# Patient Record
Sex: Female | Born: 1935 | Race: White | Hispanic: No | Marital: Married | Smoking: Never smoker
Health system: Southern US, Community
[De-identification: ages and names within clinical notes are randomized; demographics above are authoritative.]

## PROBLEM LIST (undated history)

## (undated) DIAGNOSIS — I10 Essential (primary) hypertension: Secondary | ICD-10-CM

## (undated) DIAGNOSIS — I639 Cerebral infarction, unspecified: Secondary | ICD-10-CM

## (undated) DIAGNOSIS — E039 Hypothyroidism, unspecified: Secondary | ICD-10-CM

---

## 1969-10-15 HISTORY — PX: BACK SURGERY: SHX140

## 2016-01-30 ENCOUNTER — Ambulatory Visit: Admit: 2016-01-30 | Payer: Self-pay | Admitting: Orthopaedic Surgery

## 2016-01-30 SURGERY — ARTHROSCOPY, KNEE
Anesthesia: Monitor Anesthesia Care | Laterality: Right

## 2016-03-02 NOTE — Progress Notes (Signed)
Please complete pt assessment on DOS; pre-op instructions given only.  

## 2016-03-02 NOTE — Progress Notes (Signed)
Pt denies SOB, chest pain, and being under the care of a cardiologist. Pt denies having a stress test , echo and cardiac cath. Pt stated that an EKG was done within the last year by PCP, Dr. Michel SanteeFavero; records requested. Pt not sure if she had a chest x ray with in the last year. Pt made aware to stop  taking Aspirin, vitamins, fish oil and herbal medications. Do not take any NSAIDs ie: Ibuprofen, Advil, Naproxen, BC and Goody Powder or any medication containing Aspirin. Pt verbalized understanding of all pre-op instructions.

## 2016-03-04 NOTE — Anesthesia Preprocedure Evaluation (Signed)
Anesthesia Evaluation  Patient identified by MRN, date of birth, ID band Patient awake    Reviewed: Allergy & Precautions, H&P , NPO status , Patient's Chart, lab work & pertinent test results  History of Anesthesia Complications Negative for: history of anesthetic complications  Airway Mallampati: II  TM Distance: >3 FB Neck ROM: full    Dental no notable dental hx.    Pulmonary neg pulmonary ROS,    Pulmonary exam normal breath sounds clear to auscultation       Cardiovascular negative cardio ROS Normal cardiovascular exam Rhythm:regular Rate:Normal     Neuro/Psych negative neurological ROS     GI/Hepatic negative GI ROS, Neg liver ROS,   Endo/Other  negative endocrine ROS  Renal/GU negative Renal ROS     Musculoskeletal   Abdominal   Peds  Hematology negative hematology ROS (+)   Anesthesia Other Findings   Reproductive/Obstetrics negative OB ROS                             Anesthesia Physical Anesthesia Plan  ASA: II  Anesthesia Plan: General   Post-op Pain Management:    Induction: Intravenous  Airway Management Planned: LMA  Additional Equipment:   Intra-op Plan:   Post-operative Plan:   Informed Consent: I have reviewed the patients History and Physical, chart, labs and discussed the procedure including the risks, benefits and alternatives for the proposed anesthesia with the patient or authorized representative who has indicated his/her understanding and acceptance.   Dental Advisory Given  Plan Discussed with: Anesthesiologist, CRNA and Surgeon  Anesthesia Plan Comments:         Anesthesia Quick Evaluation  

## 2016-03-04 NOTE — H&P (Signed)
Ann Holland is an 82 y.o. female.    Chief Complaint:  Right knee pain and mechanical symptoms  Patient returns having persistent problems with her knee.  She has had 2 injections by me.  She has had persistent problems with her knee with swelling, catching, she has not had any true walking.  She has known chondrocalcinosis.  X-rays demonstrate some degenerative changes.  An MRI scan was obtained which shows medial and lateral meniscal tears, as well as there is partial cartilage thickness wear and some areas of more significant cartilage wear.  Ligaments are intact.  There is some patellofemoral degenerative changes, medial tibial plateau wear.  Radial tear of the medial meniscus, horizontal tear of the lateral meniscus.  CURRENT MEDICATIONS:  She takes metoprolol, lisinopril, Synthroid, vitamin D3, astaxanthin 4 mg daily, krill oil and over-the-counter Curamin 3 tablets daily.        ALLERGIES:  She has no allergies.   PAST SURGICAL HISTORY:  Previous back surgery in 1971   SOCIAL HISTORY:  The patient is married to her husband Ann Holland.  She is here with her daughter.  She does not smoke.  She drinks a glass of wine at night.   FAMILY HISTORY:  Positive for hypertension.   REVIEW OF SYSTEMS:  Positive for knee arthritis, cataracts, hypertension, osteoporosis, mini-stroke, thyroid condition.     No results found for this or any previous visit (from the past 48 hour(s)). No results found.  Review of Systems  Constitutional: Negative.   HENT: Negative.   Eyes: Negative.   Respiratory: Negative.   Cardiovascular: Negative.   Gastrointestinal: Negative.   Genitourinary: Negative.   Musculoskeletal: Positive for joint pain.  Skin: Negative.     There were no vitals taken for this visit. Physical Exam  Constitutional: She is oriented to person, place, and time. No distress.  HENT:  Head: Atraumatic.  Eyes: EOM are normal.  Neck: Normal range of motion.  Cardiovascular: Normal  rate.   Respiratory: No respiratory distress.  GI: She exhibits no distension.  Neurological: She is alert and oriented to person, place, and time.  Skin: Skin is warm and dry.  Psychiatric: She has a normal mood and affect.   PHYSICAL EXAMINATION:  The patient is 5 feet 3 inches tall and weighs 155 pounds.  She is alert and oriented.  WD/WN.  NAD.  Extraocular movements are intact.  Good visual acuity with glasses.  There is no supraclavicular lymphadenopathy.  She has a short stride gait.  She ambulates with a cane.  There is mild bilateral knee swelling.  She has bilateral venous stasis changes in her legs with pitting edema and mild discoloration.  She is on a calcium channel blocker and also lisinopril.  At one point in the past she was on a diuretic.  BP is 131/65.  The collateral ligaments are stable.  There is patellofemoral crepitus with loading and pain with resisted extension and flexion. She has medial and lateral joint line tenderness.   RADIOGRAPHS/TESTS:  X-rays demonstrate chondrocalcinosis of the meniscus, more lateral than medial meniscus.  There is symmetric joint line narrowing and mild osteophyte spurring consistent with moderate arthritis.   Assessment Right knee DJD, medial/lateral meniscus tears, pain and mechanical symptoms.   PLAN:  We discussed options.  I do not think the scan which we reviewed, and I gave her a copy of the report, indicates she needs to consider total knee arthroplasty at this time.  We discussed options since she has  not responded to anti-inflammatories, intra-articular injections.  She understands that arthroscopy  might give her some improvement.  She understands the results of that are variable.  Treatment for the torn meniscus does well, however debridement for arthritis results are variable.  She will consider this and call if she would like to proceed.  She would need to have a preoperative clearance from [place].  Naida SleightWENS,Jaena Brocato M, PA-C 03/04/2016, 3:54 PM

## 2016-03-05 ENCOUNTER — Ambulatory Visit (HOSPITAL_COMMUNITY)
Admission: RE | Admit: 2016-03-05 | Discharge: 2016-03-05 | Disposition: A | Discharge status: Home / Self Care | Payer: Medicare Other | Source: Ambulatory Visit | Attending: Orthopaedic Surgery | Admitting: Orthopaedic Surgery

## 2016-03-05 ENCOUNTER — Ambulatory Visit (HOSPITAL_COMMUNITY): Payer: Medicare Other | Admitting: Anesthesiology

## 2016-03-05 ENCOUNTER — Ambulatory Visit (HOSPITAL_COMMUNITY): Payer: Medicare Other

## 2016-03-05 ENCOUNTER — Encounter (HOSPITAL_COMMUNITY)
Admission: RE | Disposition: A | Discharge status: Home / Self Care | Payer: Self-pay | Source: Ambulatory Visit | Attending: Orthopaedic Surgery

## 2016-03-05 ENCOUNTER — Encounter (HOSPITAL_COMMUNITY): Payer: Self-pay | Admitting: *Deleted

## 2016-03-05 DIAGNOSIS — Z8673 Personal history of transient ischemic attack (TIA), and cerebral infarction without residual deficits: Secondary | ICD-10-CM | POA: Insufficient documentation

## 2016-03-05 DIAGNOSIS — H269 Unspecified cataract: Secondary | ICD-10-CM | POA: Insufficient documentation

## 2016-03-05 DIAGNOSIS — I4510 Unspecified right bundle-branch block: Secondary | ICD-10-CM | POA: Diagnosis not present

## 2016-03-05 DIAGNOSIS — M11261 Other chondrocalcinosis, right knee: Secondary | ICD-10-CM | POA: Insufficient documentation

## 2016-03-05 DIAGNOSIS — Z8249 Family history of ischemic heart disease and other diseases of the circulatory system: Secondary | ICD-10-CM | POA: Insufficient documentation

## 2016-03-05 DIAGNOSIS — M23211 Derangement of anterior horn of medial meniscus due to old tear or injury, right knee: Secondary | ICD-10-CM | POA: Diagnosis not present

## 2016-03-05 DIAGNOSIS — M810 Age-related osteoporosis without current pathological fracture: Secondary | ICD-10-CM | POA: Insufficient documentation

## 2016-03-05 DIAGNOSIS — M2550 Pain in unspecified joint: Secondary | ICD-10-CM | POA: Diagnosis not present

## 2016-03-05 DIAGNOSIS — M23361 Other meniscus derangements, other lateral meniscus, right knee: Secondary | ICD-10-CM | POA: Insufficient documentation

## 2016-03-05 DIAGNOSIS — M170 Bilateral primary osteoarthritis of knee: Secondary | ICD-10-CM | POA: Diagnosis not present

## 2016-03-05 DIAGNOSIS — M23221 Derangement of posterior horn of medial meniscus due to old tear or injury, right knee: Secondary | ICD-10-CM | POA: Insufficient documentation

## 2016-03-05 DIAGNOSIS — E079 Disorder of thyroid, unspecified: Secondary | ICD-10-CM | POA: Insufficient documentation

## 2016-03-05 DIAGNOSIS — Z01818 Encounter for other preprocedural examination: Secondary | ICD-10-CM

## 2016-03-05 DIAGNOSIS — Z79899 Other long term (current) drug therapy: Secondary | ICD-10-CM | POA: Diagnosis not present

## 2016-03-05 HISTORY — DX: Essential (primary) hypertension: I10

## 2016-03-05 HISTORY — DX: Cerebral infarction, unspecified: I63.9

## 2016-03-05 HISTORY — DX: Hypothyroidism, unspecified: E03.9

## 2016-03-05 HISTORY — PX: KNEE ARTHROSCOPY: SHX127

## 2016-03-05 LAB — CBC
HCT: 47 % — ABNORMAL HIGH (ref 36.0–46.0)
Hemoglobin: 15.5 g/dL — ABNORMAL HIGH (ref 12.0–15.0)
MCH: 31.1 pg (ref 26.0–34.0)
MCHC: 33 g/dL (ref 30.0–36.0)
MCV: 94.4 fL (ref 78.0–100.0)
PLATELETS: 132 10*3/uL — AB (ref 150–400)
RBC: 4.98 MIL/uL (ref 3.87–5.11)
RDW: 13.1 % (ref 11.5–15.5)
WBC: 5.3 10*3/uL (ref 4.0–10.5)

## 2016-03-05 LAB — COMPREHENSIVE METABOLIC PANEL
ALT: 15 U/L (ref 14–54)
ANION GAP: 9 (ref 5–15)
AST: 18 U/L (ref 15–41)
Albumin: 3.9 g/dL (ref 3.5–5.0)
Alkaline Phosphatase: 54 U/L (ref 38–126)
BUN: 15 mg/dL (ref 6–20)
CHLORIDE: 107 mmol/L (ref 101–111)
CO2: 24 mmol/L (ref 22–32)
Calcium: 9.8 mg/dL (ref 8.9–10.3)
Creatinine, Ser: 1.01 mg/dL — ABNORMAL HIGH (ref 0.44–1.00)
GFR, EST AFRICAN AMERICAN: 58 mL/min — AB (ref >60)
GFR, EST NON AFRICAN AMERICAN: 50 mL/min — AB (ref >60)
Glucose, Bld: 117 mg/dL — ABNORMAL HIGH (ref 65–99)
POTASSIUM: 4.5 mmol/L (ref 3.5–5.1)
Sodium: 140 mmol/L (ref 135–145)
TOTAL PROTEIN: 6.8 g/dL (ref 6.5–8.1)
Total Bilirubin: 0.6 mg/dL (ref 0.3–1.2)

## 2016-03-05 LAB — URINALYSIS, ROUTINE W REFLEX MICROSCOPIC
BILIRUBIN URINE: NEGATIVE
Glucose, UA: NEGATIVE mg/dL
HGB URINE DIPSTICK: NEGATIVE
KETONES UR: NEGATIVE mg/dL
Leukocytes, UA: NEGATIVE
NITRITE: NEGATIVE
Protein, ur: NEGATIVE mg/dL
Specific Gravity, Urine: 1.015 (ref 1.005–1.030)
pH: 6.5 (ref 5.0–8.0)

## 2016-03-05 LAB — PROTIME-INR
INR: 0.96 (ref 0.00–1.49)
PROTHROMBIN TIME: 13 s (ref 11.6–15.2)

## 2016-03-05 LAB — APTT: aPTT: 21 seconds — ABNORMAL LOW (ref 24–37)

## 2016-03-05 SURGERY — ARTHROSCOPY, KNEE
Anesthesia: General | Site: Knee | Laterality: Right

## 2016-03-05 MED ORDER — ONDANSETRON HCL 4 MG/2ML IJ SOLN
INTRAMUSCULAR | Status: AC
  Filled 2016-03-05: qty 2

## 2016-03-05 MED ORDER — EPHEDRINE SULFATE 50 MG/ML IJ SOLN
INTRAMUSCULAR | Status: DC | PRN
  Administered 2016-03-05 (×3): 10 mg via INTRAVENOUS

## 2016-03-05 MED ORDER — FENTANYL CITRATE (PF) 100 MCG/2ML IJ SOLN
25.0000 ug | INTRAMUSCULAR | Status: DC | PRN
  Administered 2016-03-05 (×2): 25 ug via INTRAVENOUS
  Administered 2016-03-05: 50 ug via INTRAVENOUS

## 2016-03-05 MED ORDER — HYDROCODONE-ACETAMINOPHEN 7.5-325 MG PO TABS: 1.0000 | ORAL_TABLET | Freq: Four times a day (QID) | ORAL | Status: DC | PRN

## 2016-03-05 MED ORDER — PROPOFOL 10 MG/ML IV BOLUS
INTRAVENOUS | Status: AC
  Filled 2016-03-05: qty 20

## 2016-03-05 MED ORDER — HYDROCODONE-ACETAMINOPHEN 7.5-325 MG PO TABS: 1.0000 | ORAL_TABLET | Freq: Four times a day (QID) | ORAL | Status: AC | PRN

## 2016-03-05 MED ORDER — SODIUM CHLORIDE 0.9 % IR SOLN
Status: DC | PRN
  Administered 2016-03-05: 3000 mL

## 2016-03-05 MED ORDER — EPHEDRINE 5 MG/ML INJ
INTRAVENOUS | Status: AC
  Filled 2016-03-05: qty 10

## 2016-03-05 MED ORDER — PROPOFOL 10 MG/ML IV BOLUS
INTRAVENOUS | Status: DC | PRN
  Administered 2016-03-05: 150 mg via INTRAVENOUS

## 2016-03-05 MED ORDER — CHLORHEXIDINE GLUCONATE 4 % EX LIQD: 60.0000 mL | Freq: Once | CUTANEOUS | Status: DC

## 2016-03-05 MED ORDER — ONDANSETRON HCL 4 MG/2ML IJ SOLN
INTRAMUSCULAR | Status: DC | PRN
  Administered 2016-03-05: 4 mg via INTRAVENOUS

## 2016-03-05 MED ORDER — HYDROCODONE-ACETAMINOPHEN 7.5-325 MG PO TABS
1.0000 | ORAL_TABLET | Freq: Once | ORAL | Status: AC
  Administered 2016-03-05: 1 via ORAL

## 2016-03-05 MED ORDER — ONDANSETRON HCL 4 MG/2ML IJ SOLN: 4.0000 mg | Freq: Once | INTRAMUSCULAR | Status: DC | PRN

## 2016-03-05 MED ORDER — FENTANYL CITRATE (PF) 100 MCG/2ML IJ SOLN
INTRAMUSCULAR | Status: DC | PRN
  Administered 2016-03-05 (×5): 50 ug via INTRAVENOUS

## 2016-03-05 MED ORDER — LACTATED RINGERS IV SOLN
INTRAVENOUS | Status: DC
  Administered 2016-03-05: 09:00:00 via INTRAVENOUS

## 2016-03-05 MED ORDER — HYDROCODONE-ACETAMINOPHEN 7.5-325 MG PO TABS
ORAL_TABLET | ORAL | Status: AC
  Filled 2016-03-05: qty 2

## 2016-03-05 MED ORDER — BUPIVACAINE-EPINEPHRINE 0.25% -1:200000 IJ SOLN
INTRAMUSCULAR | Status: DC | PRN
  Administered 2016-03-05: 20 mL

## 2016-03-05 MED ORDER — FENTANYL CITRATE (PF) 250 MCG/5ML IJ SOLN
INTRAMUSCULAR | Status: AC
  Filled 2016-03-05: qty 5

## 2016-03-05 MED ORDER — CEFAZOLIN SODIUM-DEXTROSE 2-4 GM/100ML-% IV SOLN
2.0000 g | INTRAVENOUS | Status: AC
  Administered 2016-03-05: 2 g via INTRAVENOUS
  Filled 2016-03-05: qty 100

## 2016-03-05 MED ORDER — PHENYLEPHRINE HCL 10 MG/ML IJ SOLN
INTRAMUSCULAR | Status: DC | PRN
  Administered 2016-03-05: 80 ug via INTRAVENOUS

## 2016-03-05 MED ORDER — FENTANYL CITRATE (PF) 100 MCG/2ML IJ SOLN
INTRAMUSCULAR | Status: AC
  Administered 2016-03-05: 25 ug via INTRAVENOUS
  Filled 2016-03-05: qty 2

## 2016-03-05 MED ORDER — HYDROCODONE-ACETAMINOPHEN 7.5-325 MG PO TABS
1.0000 | ORAL_TABLET | Freq: Once | ORAL | Status: AC | PRN
  Administered 2016-03-05: 1 via ORAL

## 2016-03-05 MED ORDER — BUPIVACAINE-EPINEPHRINE (PF) 0.25% -1:200000 IJ SOLN
INTRAMUSCULAR | Status: AC
  Filled 2016-03-05: qty 30

## 2016-03-05 MED ORDER — DEXTROSE 5 % IV SOLN
10.0000 mg | INTRAVENOUS | Status: DC | PRN
  Administered 2016-03-05: 30 ug/min via INTRAVENOUS

## 2016-03-05 SURGICAL SUPPLY — 36 items
BANDAGE ACE 6X5 VEL STRL LF (GAUZE/BANDAGES/DRESSINGS) ×6 IMPLANT
BANDAGE ELASTIC 6 VELCRO ST LF (GAUZE/BANDAGES/DRESSINGS) ×3 IMPLANT
BENZOIN TINCTURE PRP APPL 2/3 (GAUZE/BANDAGES/DRESSINGS) ×3 IMPLANT
BLADE CUDA 4.2 (BLADE) ×3 IMPLANT
BLADE CUDA 5.5 (BLADE) IMPLANT
BLADE GREAT WHITE 4.2 (BLADE) IMPLANT
BLADE GREAT WHITE 4.2MM (BLADE)
BOOTCOVER CLEANROOM LRG (PROTECTIVE WEAR) ×6 IMPLANT
BUR OVAL 6.0 (BURR) IMPLANT
CLOSURE STERI-STRIP 1/2X4 (GAUZE/BANDAGES/DRESSINGS) ×1
CLOSURE WOUND 1/2 X4 (GAUZE/BANDAGES/DRESSINGS) ×1
CLSR STERI-STRIP ANTIMIC 1/2X4 (GAUZE/BANDAGES/DRESSINGS) ×2 IMPLANT
COVER SURGICAL LIGHT HANDLE (MISCELLANEOUS) ×3 IMPLANT
CUFF TOURNIQUET SINGLE 34IN LL (TOURNIQUET CUFF) IMPLANT
CUFF TOURNIQUET SINGLE 44IN (TOURNIQUET CUFF) IMPLANT
DRAPE ARTHROSCOPY W/POUCH 114 (DRAPES) ×3 IMPLANT
DRSG PAD ABDOMINAL 8X10 ST (GAUZE/BANDAGES/DRESSINGS) ×3 IMPLANT
DURAPREP 26ML APPLICATOR (WOUND CARE) ×3 IMPLANT
GAUZE SPONGE 4X4 12PLY STRL (GAUZE/BANDAGES/DRESSINGS) ×3 IMPLANT
GAUZE XEROFORM 1X8 LF (GAUZE/BANDAGES/DRESSINGS) IMPLANT
GLOVE BIOGEL PI IND STRL 8 (GLOVE) ×1 IMPLANT
GLOVE BIOGEL PI INDICATOR 8 (GLOVE) ×2
GLOVE ORTHO TXT STRL SZ7.5 (GLOVE) ×3 IMPLANT
GOWN STRL REUS W/ TWL LRG LVL3 (GOWN DISPOSABLE) ×2 IMPLANT
GOWN STRL REUS W/TWL 2XL LVL3 (GOWN DISPOSABLE) IMPLANT
GOWN STRL REUS W/TWL LRG LVL3 (GOWN DISPOSABLE) ×4
KIT ROOM TURNOVER OR (KITS) ×3 IMPLANT
NEEDLE HYPO 25GX1X1/2 BEV (NEEDLE) IMPLANT
PACK ARTHROSCOPY DSU (CUSTOM PROCEDURE TRAY) ×3 IMPLANT
PAD ARMBOARD 7.5X6 YLW CONV (MISCELLANEOUS) ×6 IMPLANT
PADDING CAST COTTON 6X4 STRL (CAST SUPPLIES) ×6 IMPLANT
SET ARTHROSCOPY TUBING (MISCELLANEOUS) ×2
SET ARTHROSCOPY TUBING LN (MISCELLANEOUS) ×1 IMPLANT
SPONGE LAP 4X18 X RAY DECT (DISPOSABLE) ×3 IMPLANT
STRIP CLOSURE SKIN 1/2X4 (GAUZE/BANDAGES/DRESSINGS) ×2 IMPLANT
TOWEL OR 17X24 6PK STRL BLUE (TOWEL DISPOSABLE) ×6 IMPLANT

## 2016-03-05 NOTE — Interval H&P Note (Signed)
History and Physical Interval Note:  03/05/2016 10:30 AM  Delorise RoyalsAdelia Katrinka Holland  has presented today for surgery, with the diagnosis of Right Knee Meniscal Tears  The various methods of treatment have been discussed with the patient and family. After consideration of risks, benefits and other options for treatment, the patient has consented to  Procedure(s): Right Knee Arthroscopy, Partial Medial and Lateral Menisectomy (Right) as a surgical intervention .  The patient's history has been reviewed, patient examined, no change in status, stable for surgery.  I have reviewed the patient's chart and labs.  Questions were answered to the patient's satisfaction.     Arwin Bisceglia C

## 2016-03-05 NOTE — Brief Op Note (Signed)
03/05/2016  12:03 PM  PATIENT:  Donn PieriniAdelia Costabile  82 y.o. female  PRE-OPERATIVE DIAGNOSIS:  Right Knee Meniscal Tears  POST-OPERATIVE DIAGNOSIS:  Right Knee Meniscal Tears  PROCEDURE:  Procedure(s): Right Knee Arthroscopy, Partial Medial and Lateral Menisectomy (Right)  SURGEON:  Surgeon(s) and Role:    Eldred Manges* Mark C Yates, MD - Primary     ANESTHESIA:   general  EBL:  Total I/O In: 800 [I.V.:800] Out: 10 [Blood:10]  BLOOD ADMINISTERED:none  DRAINS: none   LOCAL MEDICATIONS USED:  NONE  SPECIMEN:  No Specimen  DISPOSITION OF SPECIMEN:  N/A  COUNTS:  YES  TOURNIQUET:    DICTATION: .Dragon Dictation  PLAN OF CARE: Discharge to home after PACU  PATIENT DISPOSITION:  PACU - hemodynamically stable.

## 2016-03-05 NOTE — Anesthesia Procedure Notes (Signed)
Procedure Name: LMA Insertion Date/Time: 03/05/2016 10:43 AM Performed by: Rosiland OzMEYERS, Jolena Kittle Pre-anesthesia Checklist: Emergency Drugs available, Suction available, Patient identified, Timeout performed and Patient being monitored Patient Re-evaluated:Patient Re-evaluated prior to inductionOxygen Delivery Method: Circle system utilized Preoxygenation: Pre-oxygenation with 100% oxygen Intubation Type: IV induction LMA: LMA inserted LMA Size: 4.0 Placement Confirmation: positive ETCO2 and breath sounds checked- equal and bilateral Tube secured with: Tape Dental Injury: Teeth and Oropharynx as per pre-operative assessment

## 2016-03-05 NOTE — Anesthesia Postprocedure Evaluation (Signed)
Anesthesia Post Note  Patient: Ann Holland  Procedure(s) Performed: Procedure(s) (LRB): Right Knee Arthroscopy, Partial Medial and Lateral Menisectomy (Right)  Patient location during evaluation: PACU Anesthesia Type: General Level of consciousness: awake and alert Pain management: pain level controlled Vital Signs Assessment: post-procedure vital signs reviewed and stable Respiratory status: spontaneous breathing, nonlabored ventilation, respiratory function stable and patient connected to nasal cannula oxygen Cardiovascular status: blood pressure returned to baseline and stable Postop Assessment: no signs of nausea or vomiting Anesthetic complications: no    Last Vitals:  Filed Vitals:   03/05/16 1145 03/05/16 1200  BP: 129/54 130/45  Pulse: 72 65  Temp:    Resp: 20 14    Last Pain:  Filed Vitals:   03/05/16 1214  PainSc: 0-No pain                 Reino KentJudd, Dayami Taitt J

## 2016-03-05 NOTE — Transfer of Care (Signed)
Immediate Anesthesia Transfer of Care Note  Patient: Ann Holland  Procedure(s) Performed: Procedure(s): Right Knee Arthroscopy, Partial Medial and Lateral Menisectomy (Right)  Patient Location: PACU  Anesthesia Type:General  Level of Consciousness: awake, alert , oriented and patient cooperative  Airway & Oxygen Therapy: Patient Spontanous Breathing  Post-op Assessment: Report given to RN, Post -op Vital signs reviewed and stable and Patient moving all extremities X 4  Post vital signs: Reviewed and stable  Last Vitals:  Filed Vitals:   03/05/16 0848  BP: 167/56  Pulse: 70  Temp: 37.2 C  Resp: 20    Last Pain:  Filed Vitals:   03/05/16 0910  PainSc: 5       Patients Stated Pain Goal: 5 (03/05/16 0908)  Complications: No apparent anesthesia complications

## 2016-03-06 ENCOUNTER — Encounter (HOSPITAL_COMMUNITY): Payer: Self-pay | Admitting: Orthopaedic Surgery

## 2016-03-06 NOTE — Op Note (Signed)
NAME:  Ann Holland, Ann Holland                ACCOUNT NO.:  [id number]  MEDICAL RECORD NO.:  [id number]  LOCATION:  [facility]                         FACILITY:  [facility]  PHYSICIAN:  Naiah Donahoe C. Ophelia CharterYates, M.D.    DATE OF BIRTH:  November 25, 1929  DATE OF PROCEDURE:  03/05/2016 DATE OF DISCHARGE:  03/05/2016                              OPERATIVE REPORT   PREOPERATIVE DIAGNOSIS:  Right knee medial lateral meniscal tears.  POSTOPERATIVE DIAGNOSIS:  Right knee medial lateral meniscal tears.  PROCEDURE:  Diagnostic operative arthroscopy, right knee, partial medial meniscectomy and chondral debridement (no partial lateral meniscectomy was performed).  SURGEON:  Japneet Staggs C. Ophelia CharterYates, M.D.  ANESTHESIA:  MAC.  INDICATIONS:  This 82 year old female, who has had persistent pain and catching of her knee with MRI documented meniscal tear flap tear.  She has had persistent pain, failed intra-articular injections.  PROCEDURE IN DETAIL:  After standard prepping and draping, proximal leg holder, DuraPrep, impervious stockinette, Coban, arthroscopic sheets, drapes, pouch.  Inflow was placed with the scope, medial and lateral parapatellar tendon portals were used.  Patellofemoral joint showed grade 3 changes on the patellofemoral joint with isolated area with grade 4 changes on the lateral patellar facet and lateral aspect of the trochlear groove which was trimmed and smoothed with the shaver debriding the flap cartilage tears.  Grade 3 changes were smoothed down with a 4.2 Cuda shaver.  ACL and PCL were intact.  Lateral meniscus showed some fraying, but no definite tear.  Carefully probed, under surface checked, and meniscal capsular junctions were checked which were intact.  Popliteus was normal.  Lateral cartilage showed normal changes for her age.  Medial compartment showed grade 3 changes.  There was complex tear of the posterior meniscus with a piece that was subluxed, partial meniscectomy was performed with a small straight  flap baskets and then smoothed with a shaver.  Anterior portion of meniscus was intact except for the anterior horn which showed some partial tearing which was smoothed with a shaver.  The knee was thoroughly irrigated and aspirated.  Tincture of benzoin, Steri-Strips, Tegaderm, 4x4s, Webril, and Ace wrap were applied.  Outpatient surgery is appropriate for treatment of this condition.  Also, followup in 1 week.     Reynold Mantell C. Ophelia CharterYates, M.D.     MC[facility]/MEDQ  D:  03/05/2016  T:  03/06/2016  Job:  [id number]

## 2016-08-09 ENCOUNTER — Ambulatory Visit (INDEPENDENT_AMBULATORY_CARE_PROVIDER_SITE_OTHER): Payer: Medicare Other | Admitting: Orthopaedic Surgery

## 2016-08-09 ENCOUNTER — Encounter (INDEPENDENT_AMBULATORY_CARE_PROVIDER_SITE_OTHER): Payer: Self-pay | Admitting: Orthopaedic Surgery

## 2016-08-09 VITALS — BP 131/65 | Ht 63.0 in | Wt 155.0 lb

## 2016-08-09 DIAGNOSIS — M1711 Unilateral primary osteoarthritis, right knee: Secondary | ICD-10-CM | POA: Diagnosis not present

## 2016-08-09 NOTE — Progress Notes (Signed)
Office Visit Note   Patient: Ann Holland           Date of Birth: 19-Nov-1929           MRN: [id number] Visit Date: 08/09/2016              Requested by: No referring provider defined for this encounter. PCP: Pcp Not In System   Assessment & Plan: Visit Diagnoses:  1. Arthritis of right knee     Plan: Patient had no chondral malacia from previous arthroscopy in May with partial medial and lateral meniscectomies. We'll try an intra-articular cortisone injection see if this helps her ambulate. She rides to the store but normally does not go in and walk she does make it to the office. She has had one falling episode in the garden with some increased difficulty ambulating since that time and intermittent catching with associated synovitis.  Follow-Up Instructions: No Follow-up on file.   Orders:  No orders of the defined types were placed in this encounter.  No orders of the defined types were placed in this encounter.     Procedures: No procedures performed   Clinical Data: No additional findings.   Subjective: Chief Complaint  Patient presents with  . Right Knee - Follow-up    Patient had right knee arthroscopy 03/05/16.  She states her knee is still painful with weightbearing and she ambulates with a walker. She is sleeping better.  She states she fell about 4 weeks ago in her garden and that may have set her back a little.    Review of Systems   Objective: Vital Signs: BP 131/65   Ht 5\' 3"  (1.6 m)   Wt 155 lb (70.3 kg)   BMI 27.46 kg/m   Physical Exam  Constitutional: She is oriented to person, place, and time. She appears well-developed.  HENT:  Head: Normocephalic.  Right Ear: External ear normal.  Left Ear: External ear normal.  Eyes: Pupils are equal, round, and reactive to light.  Neck: No tracheal deviation present. No thyromegaly present.  Cardiovascular: Normal rate.   Pulmonary/Chest: Effort normal.  Abdominal: Soft.  Musculoskeletal:    Bilateral pitting edema with some venous stasis changes anteriorly bilaterally and symmetrical.  Neurological: She is alert and oriented to person, place, and time.  Skin: Skin is warm and dry.  Psychiatric: She has a normal mood and affect. Her behavior is normal.    Ortho Exam patient's immature with the walker she has crepitus of both knees. Healed arthroscopic portals from the May arthroscopy with medial lateral meniscectomies. Chest crepitus. Has bursa is tender there is an old the transverse scar that goes across the inferior pole the patella. She has both medial lateral joint line tenderness collateral ligaments are stable . Anterior cruciate ligament PCL is normal. Distal pulses are palpable. She has good flexion and extension of the elbows.  Specialty Comments:  No specialty comments available.  Imaging: No results found.   PMFS History: Patient Active Problem List   Diagnosis Date Noted  . Arthritis of right knee 08/09/2016   Past Medical History:  Diagnosis Date  . Hypertension   . Hypothyroidism    had radiation to remove part of thyroid due to hyperthyroidism  . Stroke [facility] 40 yrs Plus ago   mini stroke    No family history on file.  Past Surgical History:  Procedure Laterality Date  . BACK SURGERY  1971  . KNEE ARTHROSCOPY Right 03/05/2016   Procedure: Right Knee  Arthroscopy, Partial Medial and Lateral Menisectomy;  Surgeon: Eldred MangesMark C Denney Shein, MD;  Location: [facility] OR;  Service: Orthopedics;  Laterality: Right;   Social History   Occupational History  . Not on file.   Social History Main Topics  . Smoking status: Never Smoker  . Smokeless tobacco: Never Used  . Alcohol use 0.6 oz/week    1 Glasses of wine per week  . Drug use: No  . Sexual activity: Not on file

## 2017-10-30 IMAGING — CR DG CHEST 2V
2 series · 2 of 2 positions shown · non-contrast
Comparison: None.

CLINICAL DATA: Preop knee surgery.

EXAM:
CHEST  2 VIEW

[w chest pa]
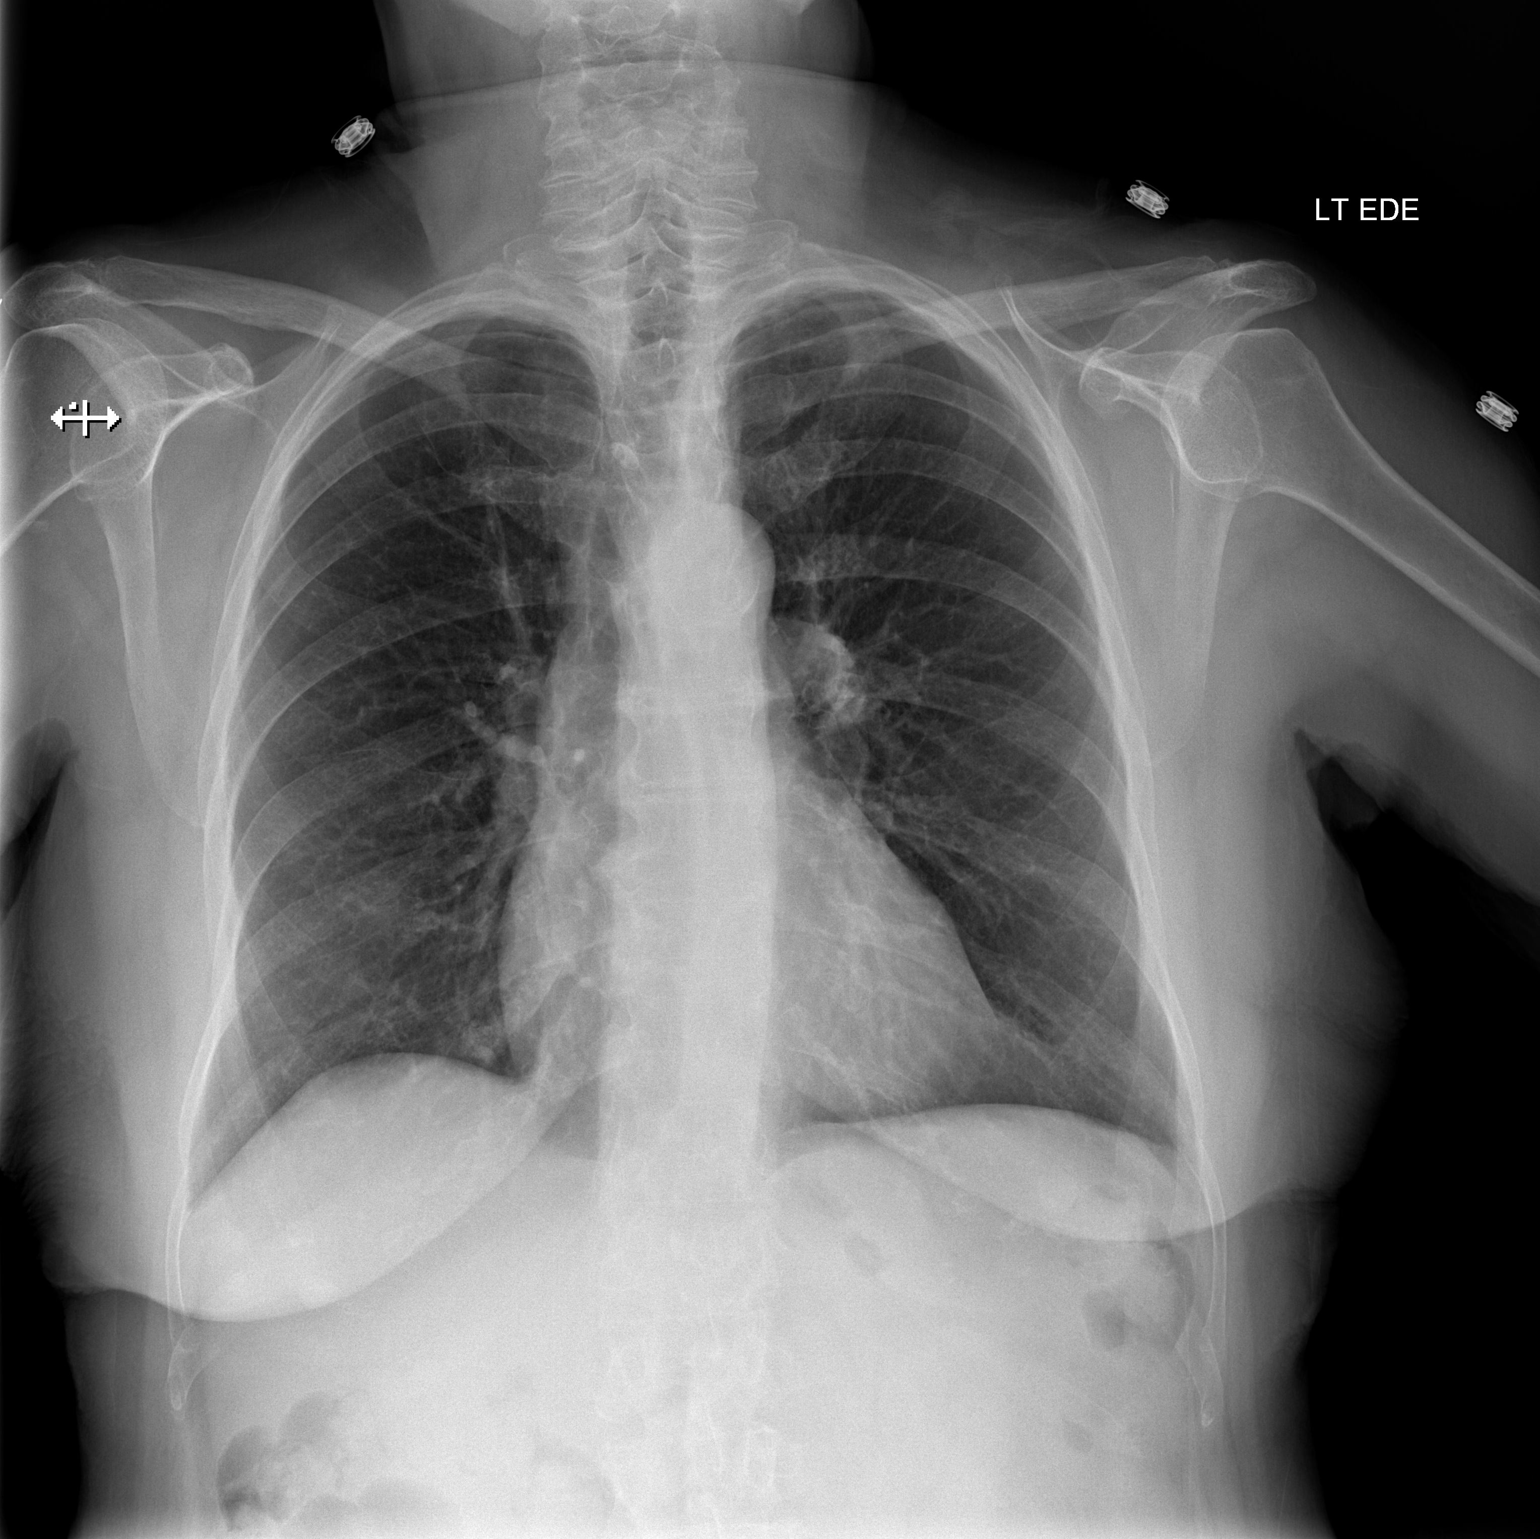

[w chest lat]
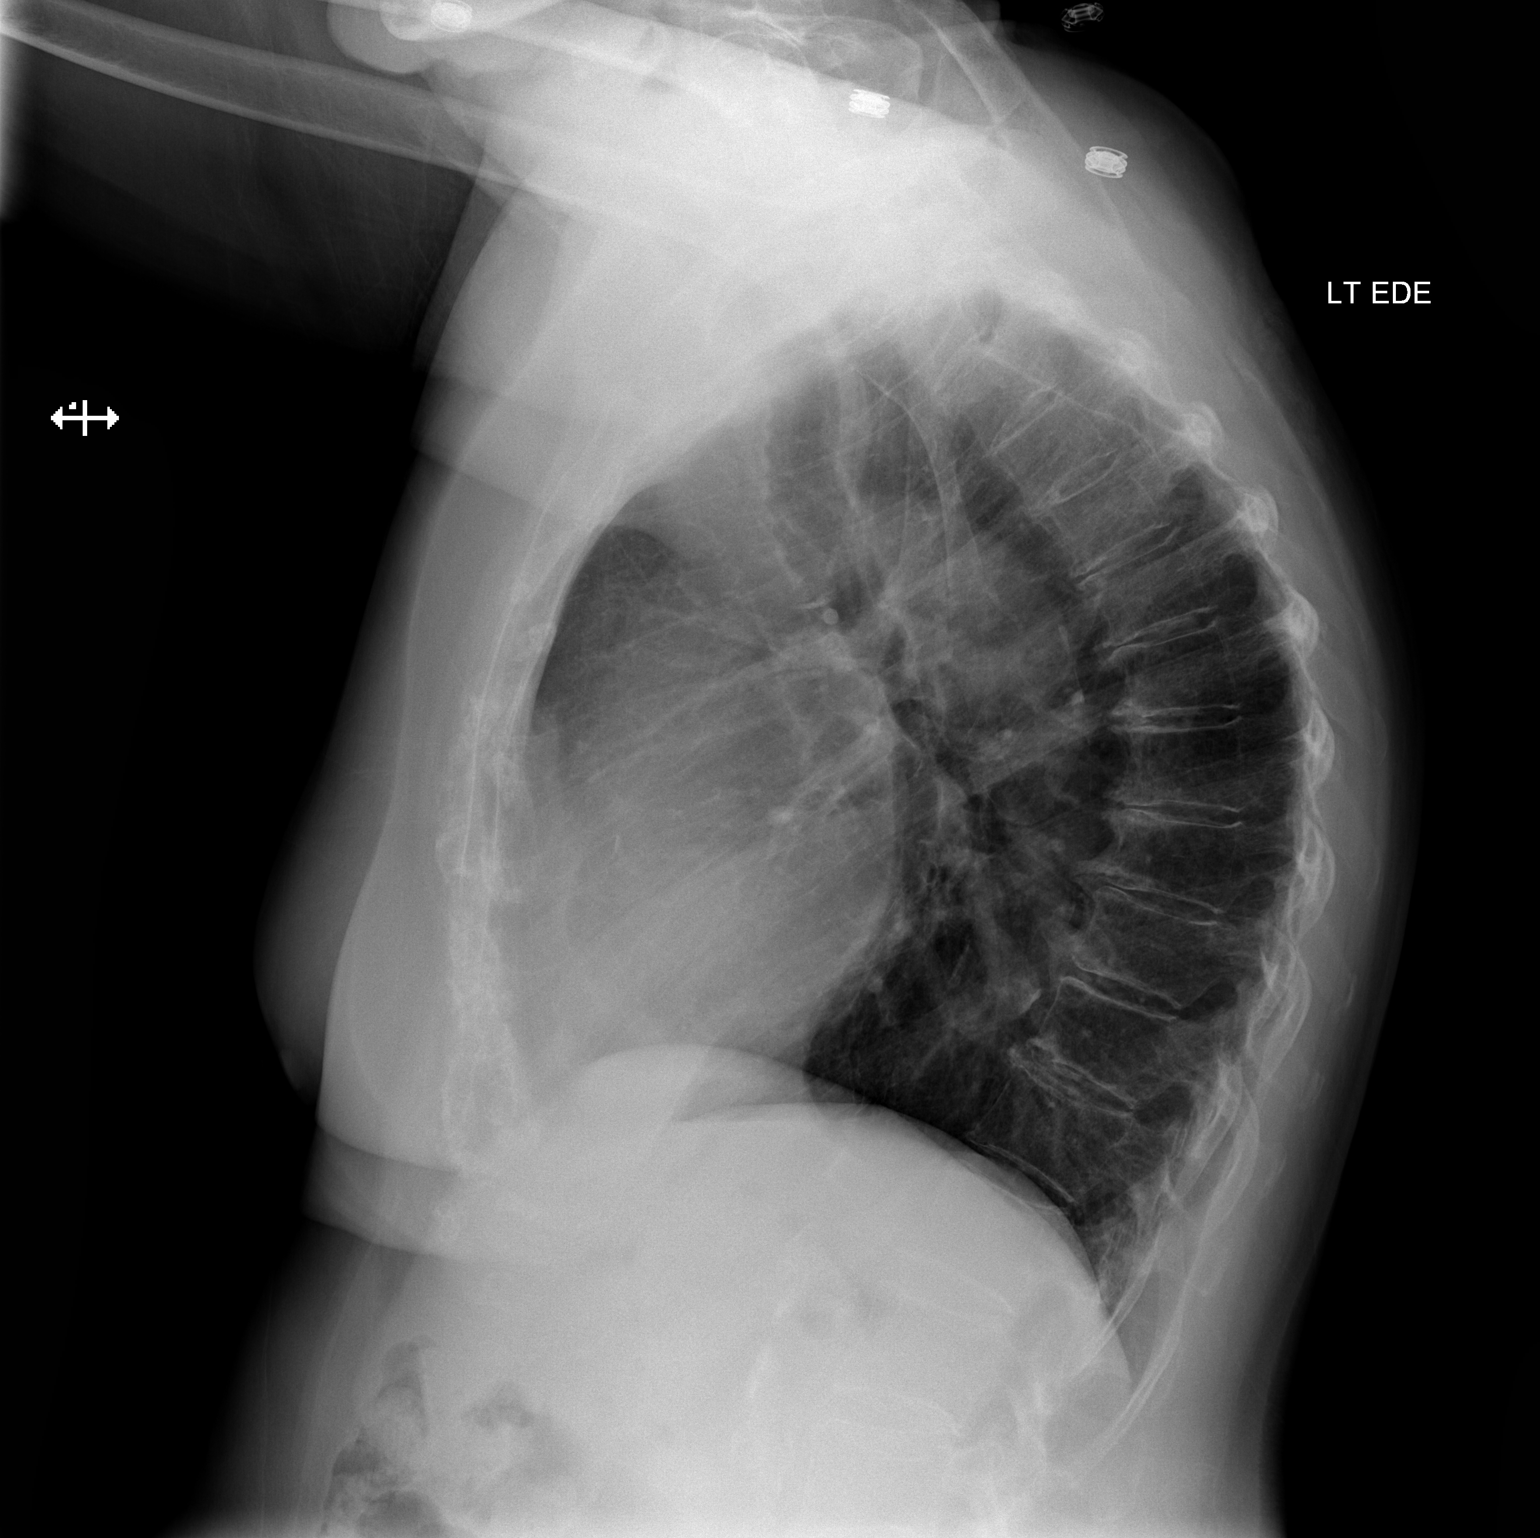

[2 of 2 positions shown; findings below may reference images not displayed]

FINDINGS: The heart size and mediastinal contours are within normal limits.
Both lungs are clear. The visualized skeletal structures are
unremarkable.
IMPRESSION: No active cardiopulmonary disease.
